# Patient Record
Sex: Female | Born: 1995 | Race: Black or African American | Hispanic: No | Marital: Married | Smoking: Current some day smoker
Health system: Southern US, Community
[De-identification: ages and names within clinical notes are randomized; demographics above are authoritative.]

## PROBLEM LIST (undated history)

## (undated) HISTORY — PX: TONSILLECTOMY: SUR1361

## (undated) HISTORY — PX: APPENDECTOMY: SHX54

---

## 2017-02-18 ENCOUNTER — Encounter (HOSPITAL_COMMUNITY): Payer: Self-pay | Admitting: Neurology

## 2017-02-18 ENCOUNTER — Emergency Department (HOSPITAL_COMMUNITY): Payer: Self-pay

## 2017-02-18 ENCOUNTER — Emergency Department (HOSPITAL_COMMUNITY)
Admission: EM | Admit: 2017-02-18 | Discharge: 2017-02-18 | Disposition: A | Discharge status: Home / Self Care | Payer: Self-pay | Attending: Emergency Medicine | Admitting: Emergency Medicine

## 2017-02-18 DIAGNOSIS — F17200 Nicotine dependence, unspecified, uncomplicated: Secondary | ICD-10-CM | POA: Insufficient documentation

## 2017-02-18 DIAGNOSIS — Z3A01 Less than 8 weeks gestation of pregnancy: Secondary | ICD-10-CM | POA: Insufficient documentation

## 2017-02-18 DIAGNOSIS — R1032 Left lower quadrant pain: Secondary | ICD-10-CM

## 2017-02-18 DIAGNOSIS — O2691 Pregnancy related conditions, unspecified, first trimester: Secondary | ICD-10-CM | POA: Insufficient documentation

## 2017-02-18 LAB — URINALYSIS, ROUTINE W REFLEX MICROSCOPIC
Bilirubin Urine: NEGATIVE
GLUCOSE, UA: NEGATIVE mg/dL
HGB URINE DIPSTICK: NEGATIVE
Ketones, ur: NEGATIVE mg/dL
Leukocytes, UA: NEGATIVE
Nitrite: NEGATIVE
PH: 6 (ref 5.0–8.0)
PROTEIN: NEGATIVE mg/dL
Specific Gravity, Urine: 1.026 (ref 1.005–1.030)

## 2017-02-18 LAB — CBC
HEMATOCRIT: 37.3 % (ref 36.0–46.0)
Hemoglobin: 12.7 g/dL (ref 12.0–15.0)
MCH: 28 pg (ref 26.0–34.0)
MCHC: 34 g/dL (ref 30.0–36.0)
MCV: 82.2 fL (ref 78.0–100.0)
PLATELETS: 367 10*3/uL (ref 150–400)
RBC: 4.54 MIL/uL (ref 3.87–5.11)
RDW: 13.7 % (ref 11.5–15.5)
WBC: 9.9 10*3/uL (ref 4.0–10.5)

## 2017-02-18 LAB — COMPREHENSIVE METABOLIC PANEL
ALT: 16 U/L (ref 14–54)
AST: 31 U/L (ref 15–41)
Albumin: 3.9 g/dL (ref 3.5–5.0)
Alkaline Phosphatase: 59 U/L (ref 38–126)
Anion gap: 10 (ref 5–15)
BUN: 10 mg/dL (ref 6–20)
CHLORIDE: 106 mmol/L (ref 101–111)
CO2: 20 mmol/L — AB (ref 22–32)
CREATININE: 0.82 mg/dL (ref 0.44–1.00)
Calcium: 9.3 mg/dL (ref 8.9–10.3)
GFR calc Af Amer: >60 mL/min (ref >60)
GFR calc non Af Amer: >60 mL/min (ref >60)
Glucose, Bld: 115 mg/dL — ABNORMAL HIGH (ref 65–99)
POTASSIUM: 3.6 mmol/L (ref 3.5–5.1)
SODIUM: 136 mmol/L (ref 135–145)
Total Bilirubin: 0.4 mg/dL (ref 0.3–1.2)
Total Protein: 7.3 g/dL (ref 6.5–8.1)

## 2017-02-18 LAB — I-STAT BETA HCG BLOOD, ED (MC, WL, AP ONLY): I-stat hCG, quantitative: 505.5 m[IU]/mL — ABNORMAL HIGH (ref <5)

## 2017-02-18 LAB — WET PREP, GENITAL
CLUE CELLS WET PREP: NONE SEEN
SPERM: NONE SEEN
TRICH WET PREP: NONE SEEN
YEAST WET PREP: NONE SEEN

## 2017-02-18 LAB — LIPASE, BLOOD: LIPASE: 44 U/L (ref 11–51)

## 2017-02-18 MED ORDER — PRENATAL VITAMIN AND MINERAL 28-0.8 MG PO TABS: 1.0000 | ORAL_TABLET | Freq: Once | ORAL | 1 refills | Status: AC

## 2017-02-18 NOTE — ED Provider Notes (Signed)
[facility]-EMERGENCY DEPT Provider Note   CSN: [id number] Arrival date & time: 02/18/17  7106     History   Chief Complaint Chief Complaint  Patient presents with  . Abdominal Pain    HPI Janice Estrada is a 21 y.o. female.  HPI Patient reports she always has irregular periods. She has not been on birth control for several years. She is sexually active with her husband. She has noted cramping lower abdominal pain with some concentration in the left lower abdomen with intermittent sharp pains for about 4 days. He has been feeling nauseated with no vomiting and no diarrhea. Experiencing some chills without fever. She also notes that her mood has been more labile for a couple of days and she is starting to note some breast tenderness. Patient took a pregnancy test about 4 weeks ago that was negative. History reviewed. No pertinent past medical history.  There are no active problems to display for this patient.   Past Surgical History:  Procedure Laterality Date  . APPENDECTOMY    . TONSILLECTOMY      OB History    No data available       Home Medications    Prior to Admission medications   Medication Sig Start Date End Date Taking? Authorizing Provider  Prenatal Vit-Fe Fumarate-FA (PRENATAL VITAMIN AND MINERAL) 28-0.8 MG TABS Take 1 tablet by mouth once. 02/18/17 02/18/17  Arby Barrette, MD    Family History No family history on file.  Social History Social History  Substance Use Topics  . Smoking status: Current Some Day Smoker  . Smokeless tobacco: Never Used  . Alcohol use Yes     Allergies   Patient has no known allergies.   Review of Systems Review of Systems 10 Systems reviewed and are negative for acute change except as noted in the HPI.   Physical Exam Updated Vital Signs BP (!) 122/91 (BP Location: Left Arm)   Pulse 71   Temp 98.5 F (36.9 C) (Oral)   Resp 16   Ht  (1.727 m)   Wt 112.5 kg (248 lb)   LMP  (LMP Unknown)   SpO2 97%   BMI  37.71 kg/m   Physical Exam  Constitutional: She is oriented to person, place, and time. She appears well-developed and well-nourished. No distress.  HENT:  Head: Normocephalic and atraumatic.  Eyes: Conjunctivae are normal.  Neck: Neck supple. No thyromegaly present.  Cardiovascular: Normal rate and regular rhythm.   No murmur heard. Pulmonary/Chest: Effort normal and breath sounds normal. No respiratory distress.  Abdominal: Soft. She exhibits no distension and no mass. There is tenderness. There is no guarding.  Moderate left lower quadrant tenderness no guarding.  Genitourinary: Vagina normal and uterus normal.  Genitourinary Comments: External genitalia. No discharge in vaginal vault. Cervix normal without friability. No nontender.  Musculoskeletal: Normal range of motion. She exhibits no edema or tenderness.  Neurological: She is alert and oriented to person, place, and time. No cranial nerve deficit. She exhibits normal muscle tone. Coordination normal.  Skin: Skin is warm and dry.  Psychiatric: She has a normal mood and affect.  Nursing note and vitals reviewed.    ED Treatments / Results  Labs (all labs ordered are listed, but only abnormal results are displayed) Labs Reviewed  COMPREHENSIVE METABOLIC PANEL - Abnormal; Notable for the following:       Result Value   CO2 20 (*)    Glucose, Bld 115 (*)    All other  components within normal limits  I-STAT BETA HCG BLOOD, ED ([facility], [facility], [facility] ONLY) - Abnormal; Notable for the following:    I-stat hCG, quantitative 505.5 (*)    All other components within normal limits  LIPASE, BLOOD  CBC  URINALYSIS, ROUTINE W REFLEX MICROSCOPIC  RPR  HIV ANTIBODY (ROUTINE TESTING)  GC/CHLAMYDIA PROBE AMP () NOT AT [facility]    EKG  EKG Interpretation None       Radiology US Ob Comp < 14 Wks  Result Date: 02/18/2017 CLINICAL DATA:  21 year old pregnant female with pelvic pain for several days. Beta HCG of 505. EXAM: OBSTETRIC  <14 WK [place] AND TRANSVAGINAL OB US TECHNIQUE: Both transabdominal and transvaginal ultrasound examinations were performed for complete evaluation of the gestation as well as the maternal uterus, adnexal regions, and pelvic cul-de-sac. Transvaginal technique was performed to assess early pregnancy. COMPARISON:  None. FINDINGS: Intrauterine gestational sac: None Yolk sac:  Not Visualized. Embryo:  Not Visualized. Maternal uterus/adnexae: The uterus is unremarkable. A 2.4 cm cyst/corpus luteum within the right ovary noted. The left ovary is unremarkable. A small amount of free pelvic fluid is identified. No adnexal mass noted. IMPRESSION: 1. No IUP or adnexal mass visualized. Differential diagnosis includes recent spontaneous abortion, IUP too early to visualize, and occult ectopic pregnancy. Recommend close follow up of quantitative B-HCG levels, and follow up US as clinically warranted. Electronically Signed   By: Harmon Pier M.D.   On: 02/18/2017 11:12   US Ob Transvaginal  Result Date: 02/18/2017 CLINICAL DATA:  21 year old pregnant female with pelvic pain for several days. Beta HCG of 505. EXAM: OBSTETRIC <14 WK [place] AND TRANSVAGINAL OB US TECHNIQUE: Both transabdominal and transvaginal ultrasound examinations were performed for complete evaluation of the gestation as well as the maternal uterus, adnexal regions, and pelvic cul-de-sac. Transvaginal technique was performed to assess early pregnancy. COMPARISON:  None. FINDINGS: Intrauterine gestational sac: None Yolk sac:  Not Visualized. Embryo:  Not Visualized. Maternal uterus/adnexae: The uterus is unremarkable. A 2.4 cm cyst/corpus luteum within the right ovary noted. The left ovary is unremarkable. A small amount of free pelvic fluid is identified. No adnexal mass noted. IMPRESSION: 1. No IUP or adnexal mass visualized. Differential diagnosis includes recent spontaneous abortion, IUP too early to visualize, and occult ectopic pregnancy. Recommend close follow  up of quantitative B-HCG levels, and follow up US as clinically warranted. Electronically Signed   By: Harmon Pier M.D.   On: 02/18/2017 11:12    Procedures Procedures (including critical care time)  Medications Ordered in ED Medications - No data to display   Initial Impression / Assessment and Plan / ED Course  I have reviewed the triage vital signs and the nursing notes.  Pertinent labs & imaging results that were available during my care of the patient were reviewed by me and considered in my medical decision making (see chart for details).     Final Clinical Impressions(s) / ED Diagnoses   Final diagnoses:  Left lower quadrant pain  Less than [redacted] weeks gestation of pregnancy  Patient counseled on follow-up plan with OB/GYN. Patient has a provider she will contact. Prenatal vitamins prescribed. Ultrasound does not identify ectopic. Pregnancies early. Return precautions reviewed.  New Prescriptions New Prescriptions   PRENATAL VIT-FE FUMARATE-FA (PRENATAL VITAMIN AND MINERAL) 28-0.8 MG TABS    Take 1 tablet by mouth once.     Arby Barrette, MD 02/18/17 1242

## 2017-02-18 NOTE — ED Notes (Signed)
Pt. Placed in room and went directly to [place]

## 2017-02-18 NOTE — ED Notes (Signed)
Pt. Just returned from US.

## 2017-02-18 NOTE — ED Triage Notes (Signed)
Pt reports left upper, mid abd pain x several days. Pain started mid lower abd. Reports nausea, denies v/d. Is a x 4. Reports irregular periods.

## 2017-02-18 NOTE — ED Notes (Signed)
Pt reports cramping pain and white/pink colored discharge. Denies pain at present. Resting comfortably.

## 2017-02-19 LAB — RPR: RPR: NONREACTIVE

## 2017-02-19 LAB — HIV ANTIBODY (ROUTINE TESTING W REFLEX): HIV SCREEN 4TH GENERATION: NONREACTIVE

## 2017-02-21 LAB — GC/CHLAMYDIA PROBE AMP (~~LOC~~) NOT AT ARMC
Chlamydia: NEGATIVE
NEISSERIA GONORRHEA: NEGATIVE

## 2018-07-16 IMAGING — US US OB TRANSVAGINAL
1 series · 14 of 28 positions shown · non-contrast
Comparison: None.

CLINICAL DATA: 21-year-old pregnant female with pelvic pain for
several days. Beta HCG of 505.

EXAM:
OBSTETRIC <14 WK US AND TRANSVAGINAL OB US
TECHNIQUE: Both transabdominal and transvaginal ultrasound examinations were
performed for complete evaluation of the gestation as well as the
maternal uterus, adnexal regions, and pelvic cul-de-sac.
Transvaginal technique was performed to assess early pregnancy.

[Series 1: us ob transvaginal · 0.26mm/px · 14 of 55 slices shown]
[im 3/55]
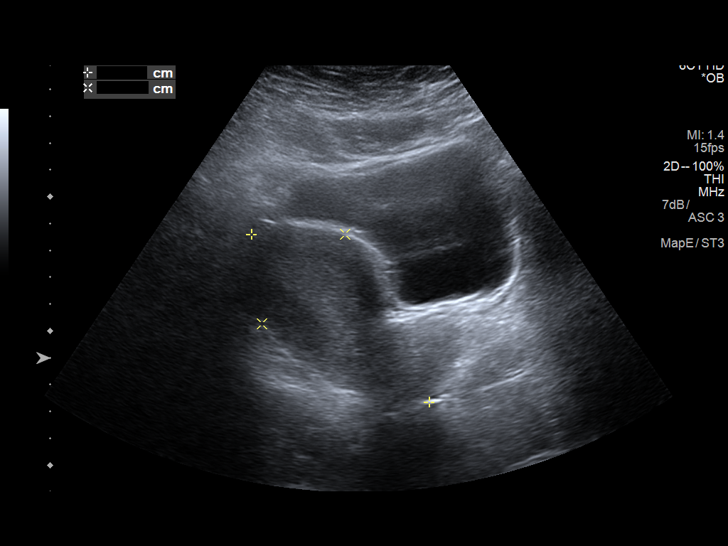
[im 7/55]
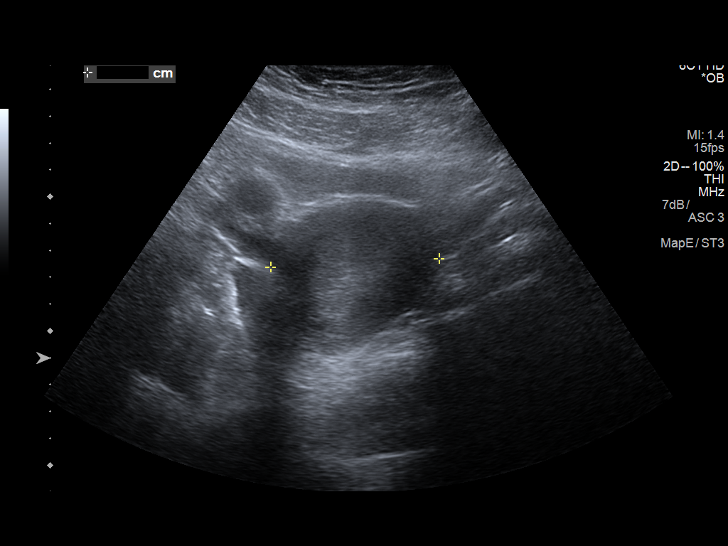
[im 11/55]
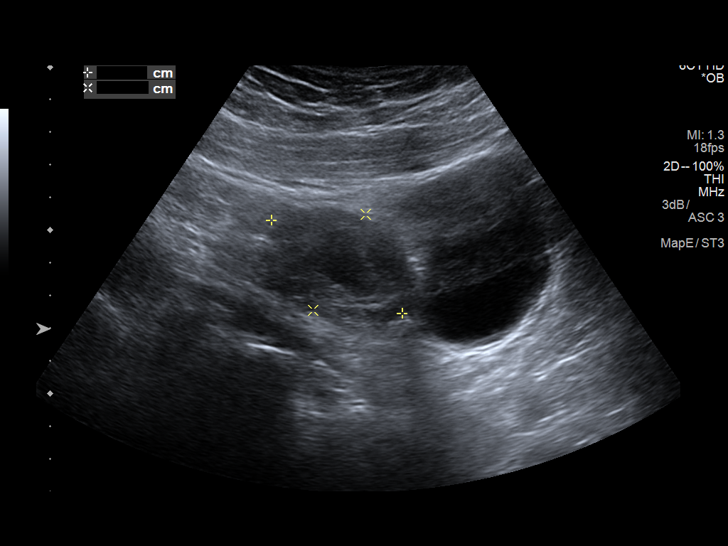
[im 15/55]
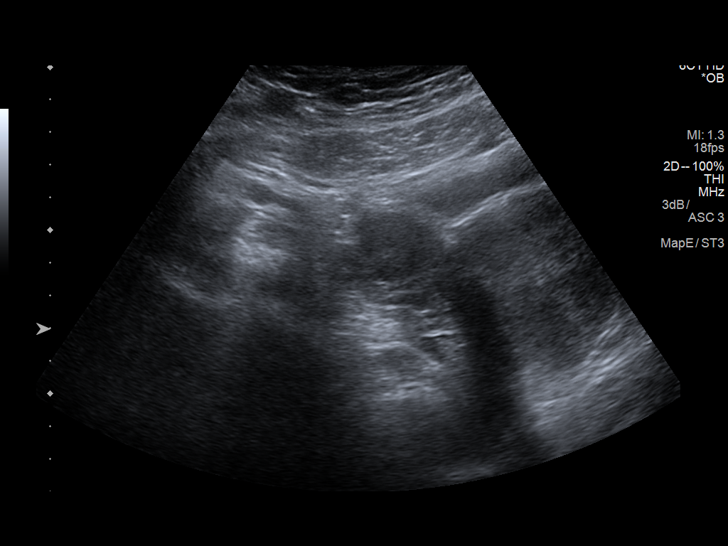
[im 19/55]
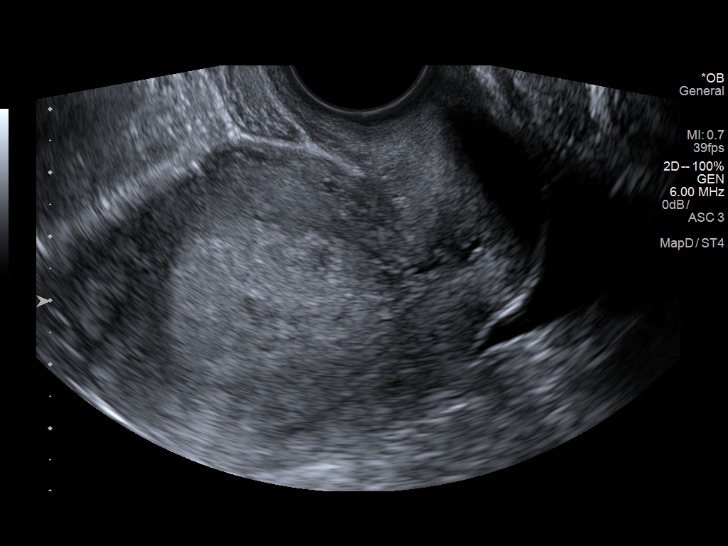
[im 23/55]
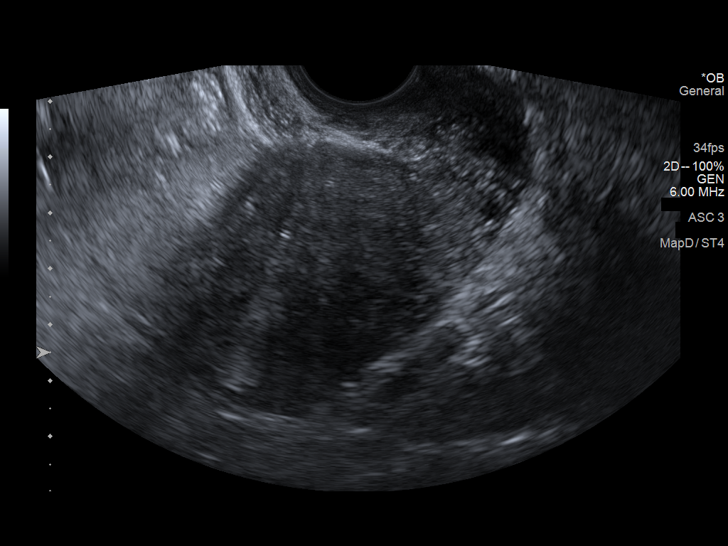
[im 27/55]
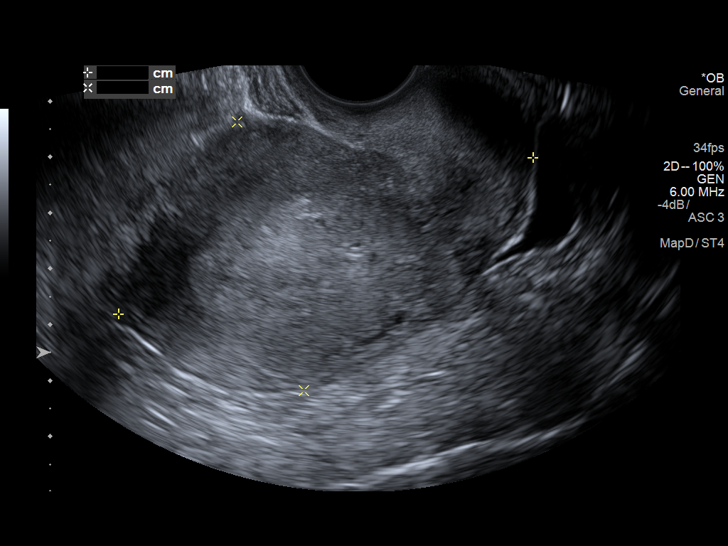
[im 31/55]
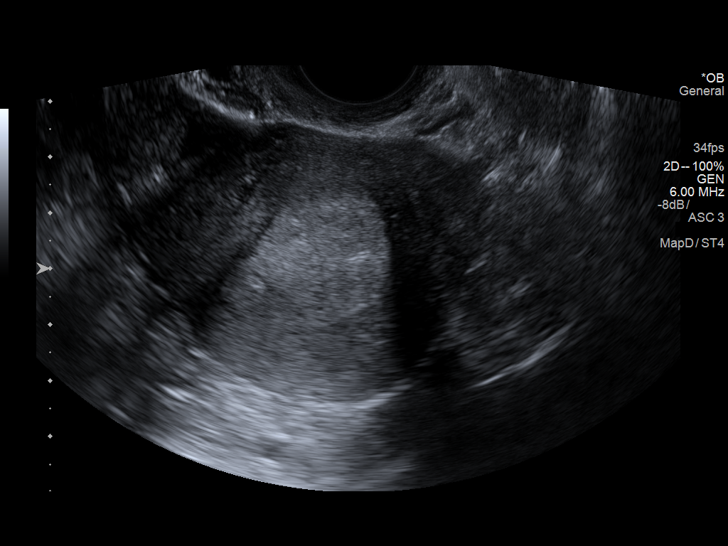
[im 35/55]
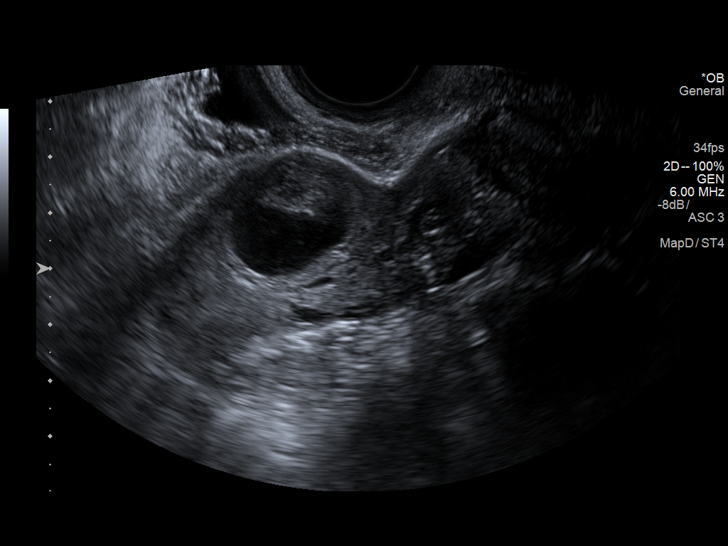
[im 39/55]
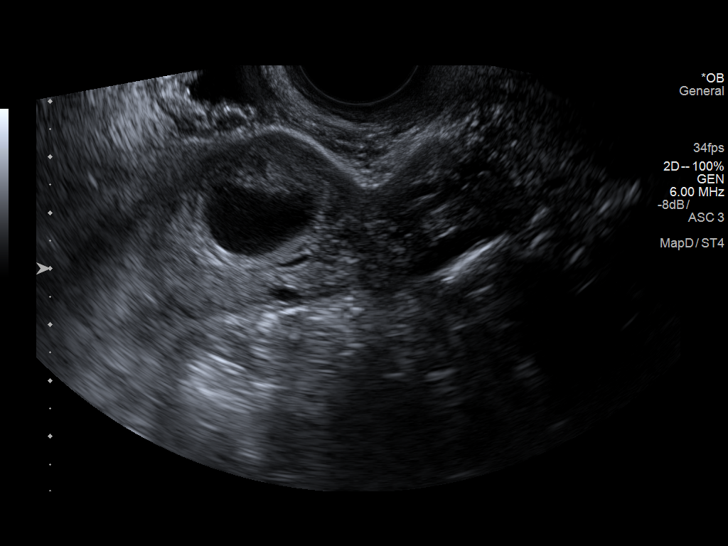
[im 43/55]
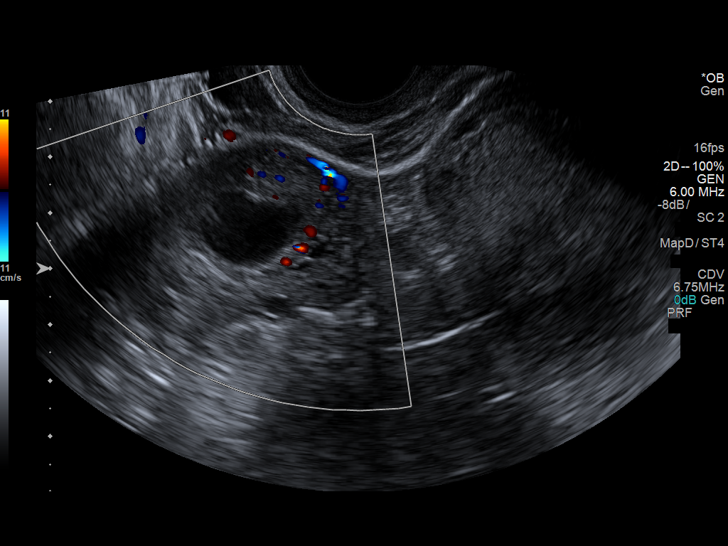
[im 47/55]
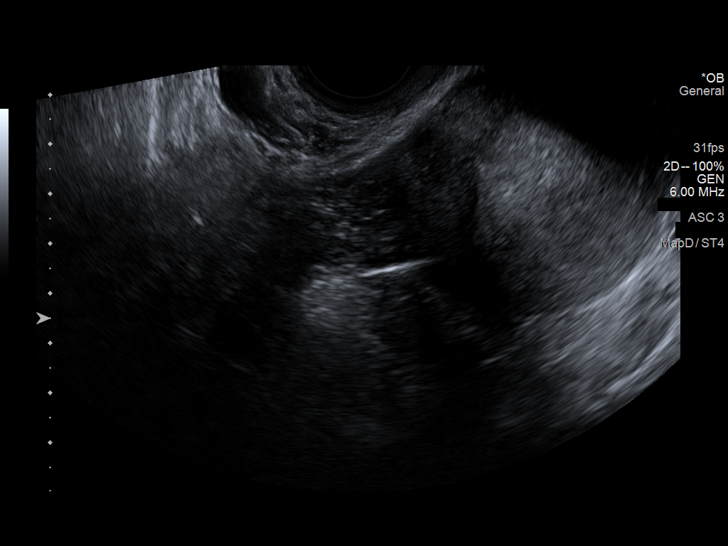
[im 51/55]
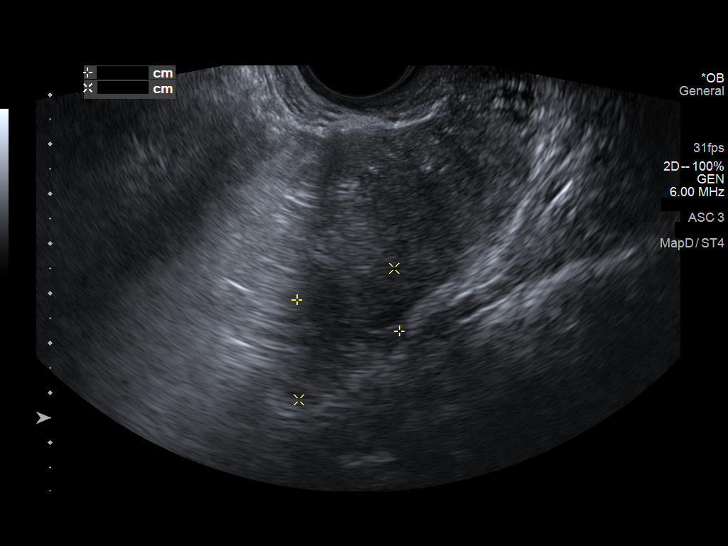
[im 55/55]
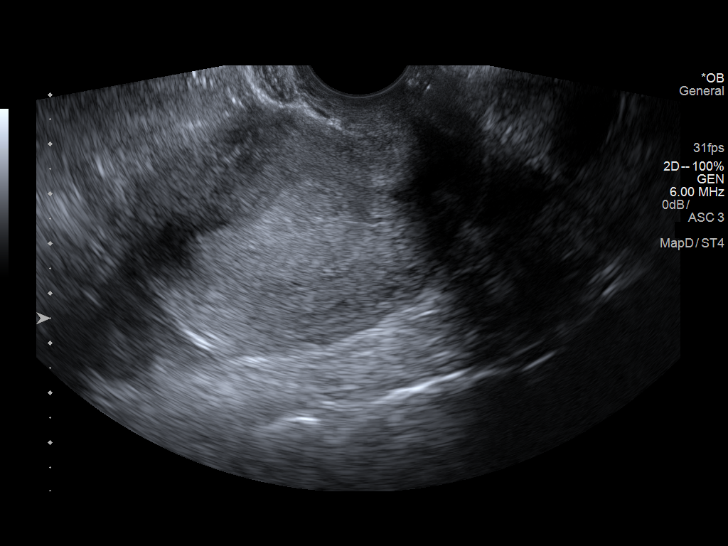

[14 of 28 positions shown; findings below may reference images not displayed]

FINDINGS: Intrauterine gestational sac: None

Yolk sac:  Not Visualized.

Embryo:  Not Visualized.

Maternal uterus/adnexae:

The uterus is unremarkable.

A 2.4 cm cyst/corpus luteum within the right ovary noted.

The left ovary is unremarkable.

A small amount of free pelvic fluid is identified.

No adnexal mass noted.
IMPRESSION: 1. No IUP or adnexal mass visualized. Differential diagnosis
includes recent spontaneous abortion, IUP too early to visualize,
and occult ectopic pregnancy. Recommend close follow up of
quantitative B-HCG levels, and follow up US as clinically warranted.
